# Patient Record
Sex: Male | Born: 1995 | Race: White | Hispanic: No | Marital: Single | Smoking: Never smoker
Health system: Southern US, Community
[De-identification: ages and names within clinical notes are randomized; demographics above are authoritative.]

---

## 2017-06-10 ENCOUNTER — Emergency Department
Admission: EM | Admit: 2017-06-10 | Discharge: 2017-06-10 | Disposition: A | Discharge status: Home / Self Care | Payer: BLUE CROSS/BLUE SHIELD | Attending: Emergency Medicine | Admitting: Emergency Medicine

## 2017-06-10 ENCOUNTER — Emergency Department: Payer: BLUE CROSS/BLUE SHIELD

## 2017-06-10 ENCOUNTER — Encounter: Payer: Self-pay | Admitting: Emergency Medicine

## 2017-06-10 DIAGNOSIS — R1032 Left lower quadrant pain: Secondary | ICD-10-CM

## 2017-06-10 DIAGNOSIS — R1012 Left upper quadrant pain: Secondary | ICD-10-CM | POA: Insufficient documentation

## 2017-06-10 DIAGNOSIS — R109 Unspecified abdominal pain: Secondary | ICD-10-CM

## 2017-06-10 LAB — URINALYSIS, COMPLETE (UACMP) WITH MICROSCOPIC
BACTERIA UA: NONE SEEN
BILIRUBIN URINE: NEGATIVE
Glucose, UA: NEGATIVE mg/dL
HGB URINE DIPSTICK: NEGATIVE
Ketones, ur: 20 mg/dL — AB
LEUKOCYTES UA: NEGATIVE
NITRITE: NEGATIVE
PH: 5 (ref 5.0–8.0)
Protein, ur: NEGATIVE mg/dL
SPECIFIC GRAVITY, URINE: 1.017 (ref 1.005–1.030)
Squamous Epithelial / LPF: NONE SEEN

## 2017-06-10 LAB — BASIC METABOLIC PANEL
ANION GAP: 8 (ref 5–15)
BUN: 13 mg/dL (ref 6–20)
CALCIUM: 9.7 mg/dL (ref 8.9–10.3)
CHLORIDE: 105 mmol/L (ref 101–111)
CO2: 26 mmol/L (ref 22–32)
Creatinine, Ser: 0.99 mg/dL (ref 0.61–1.24)
GFR calc Af Amer: >60 mL/min (ref >60)
GFR calc non Af Amer: >60 mL/min (ref >60)
GLUCOSE: 83 mg/dL (ref 65–99)
POTASSIUM: 4.3 mmol/L (ref 3.5–5.1)
Sodium: 139 mmol/L (ref 135–145)

## 2017-06-10 LAB — CBC
HCT: 46.4 % (ref 40.0–52.0)
Hemoglobin: 16.1 g/dL (ref 13.0–18.0)
MCH: 30.9 pg (ref 26.0–34.0)
MCHC: 34.6 g/dL (ref 32.0–36.0)
MCV: 89.3 fL (ref 80.0–100.0)
PLATELETS: 217 10*3/uL (ref 150–440)
RBC: 5.2 MIL/uL (ref 4.40–5.90)
RDW: 13 % (ref 11.5–14.5)
WBC: 4.7 10*3/uL (ref 3.8–10.6)

## 2017-06-10 MED ORDER — SODIUM CHLORIDE 0.9 % IV BOLUS (SEPSIS)
1000.0000 mL | Freq: Once | INTRAVENOUS | Status: AC
  Administered 2017-06-10: 1000 mL via INTRAVENOUS

## 2017-06-10 MED ORDER — KETOROLAC TROMETHAMINE 30 MG/ML IJ SOLN
30.0000 mg | Freq: Once | INTRAMUSCULAR | Status: AC
  Administered 2017-06-10: 30 mg via INTRAVENOUS
  Filled 2017-06-10: qty 1

## 2017-06-10 NOTE — ED Provider Notes (Signed)
[facility] Emergency Department Provider Note  ____________________________________________  Time seen: Approximately 8:04 AM  I have reviewed the triage vital signs and the nursing notes.   HISTORY  Chief Complaint Flank Pain    HPI Adam Collier is a 21 y.o. 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 The patient states that for approximately a week, he has had intermittent left flank pain that has been dull and initially started high in the left flank and has now moved to the left lower quadrant. He denies any nausea or vomiting, diarrhea, fever or chills, hematuria, dysuria or urinary frequency. He was seen at urgent care last week (no imaging performed) and initiated on ciprofloxacin, Flomax and diclofenac, but continues to have ongoing symptoms. His pain does improve with diclofenac. His pain is worst in the morning. Today, the patient has no change in his pain, but he is presenting for ongoing symptoms.   History reviewed. No pertinent past medical history.  There are no active problems to display for this patient.   No past surgical history on file.    Allergies Patient has no known allergies.  No family history on file.  Social History Social History  Substance Use Topics  . Smoking status: Not on file  . Smokeless tobacco: Not on file  . Alcohol use Not on file    Review of Systems Constitutional: No fever/chills. No lightheadedness or syncope. Eyes: No visual changes. ENT: No sore throat. No congestion or rhinorrhea. Cardiovascular: Denies chest pain. Denies palpitations. Respiratory: Denies shortness of breath.  No cough. Gastrointestinal: Positive left flank pain and left lower quadrant abdominal pain.  No nausea, no vomiting.  No diarrhea.  No constipation. Genitourinary: Negative for dysuria. No hematuria. No testicular or scrotal pain. No  penile discharge. Musculoskeletal: Negative for midline back pain. Skin: Negative for rash. Neurological: Negative for headaches. No focal numbness, tingling or weakness.     ____________________________________________   PHYSICAL EXAM:  VITAL SIGNS: ED Triage Vitals [06/10/17 0739]  Enc Vitals Group     BP (!) 144/88     Pulse Rate 79     Resp 18     Temp 97.9 F (36.6 C)     Temp Source Oral     SpO2 99 %     Weight 150 lb (68 kg)     Height 6' (1.829 m)     Head Circumference      Peak Flow      Pain Score 4     Pain Loc      Pain Edu?      Excl. in [place]?     Constitutional: Alert and oriented. Well appearing and in no acute distress. Answers questions appropriately. Eyes: Conjunctivae are normal.  EOMI. No scleral icterus. Head: Atraumatic. Nose: No congestion/rhinnorhea. Mouth/Throat: Mucous membranes are mildly dry.  Neck: No stridor.  Supple.  No JVD. No meningismus. Cardiovascular: Normal rate, regular rhythm. No murmurs, rubs or gallops.  Respiratory: Normal respiratory effort.  No accessory muscle use or retractions. Lungs CTAB.  No wheezes, rales or ronchi. Gastrointestinal: Soft, and nondistended.  Minimal tenderness to palpation with deep palpation in the low left lower quadrant. No reproducible CVA tenderness to palpation. No guarding or rebound.  No peritoneal signs. Musculoskeletal: No LE edema.  Neurologic:  A&Ox3.  Speech is clear.  Face and smile are symmetric.  EOMI.  Moves all extremities well.  Skin:  Skin is warm, dry and intact. No rash noted. Psychiatric: Mood and affect are normal. Speech and behavior are normal.  Normal judgement.  ____________________________________________   LABS (all labs ordered are listed, but only abnormal results are displayed)  Labs Reviewed  URINALYSIS, COMPLETE (UACMP) WITH MICROSCOPIC - Abnormal; Notable for the following:       Result Value   Color, Urine YELLOW (*)    APPearance CLEAR (*)    Ketones, ur 20  (*)    All other components within normal limits  CBC  BASIC METABOLIC PANEL   ____________________________________________  EKG  Not indicated ____________________________________________  RADIOLOGY  Ct Renal Stone Study  Result Date: 06/10/2017 CLINICAL DATA:  Acute left flank pain. EXAM: CT ABDOMEN AND PELVIS WITHOUT CONTRAST TECHNIQUE: Multidetector CT imaging of the abdomen and pelvis was performed following the standard protocol without IV contrast. COMPARISON:  None. FINDINGS: Lower chest: No acute abnormality. Hepatobiliary: No focal liver abnormality is seen. No gallstones, gallbladder wall thickening, or biliary dilatation. Pancreas: Unremarkable. No pancreatic ductal dilatation or surrounding inflammatory changes. Spleen: Normal in size without focal abnormality. Adrenals/Urinary Tract: Adrenal glands are unremarkable. Kidneys are normal, without renal calculi, focal lesion, or hydronephrosis. Bladder is unremarkable. Stomach/Bowel: Stomach is within normal limits. Appendix appears normal. No evidence of bowel wall thickening, distention, or inflammatory changes. Vascular/Lymphatic: No significant vascular findings are present. No enlarged abdominal or pelvic lymph nodes. Reproductive: Prostate is unremarkable. Other: No abdominal wall hernia or abnormality. No abdominopelvic ascites. Musculoskeletal: No acute or significant osseous findings. IMPRESSION: No renal or ureteral calculi are noted. No hydronephrosis or renal obstruction is noted. No abnormality seen in the abdomen or pelvis. Electronically Signed   By: Lupita Raider, M.D.   On: 06/10/2017 09:04    ____________________________________________   PROCEDURES  Procedure(s) performed: None  Procedures  Critical Care performed: No ____________________________________________   INITIAL IMPRESSION / ASSESSMENT AND PLAN / ED COURSE  Pertinent labs & imaging results that were available during my care of the patient  were reviewed by me and considered in my medical decision making (see chart for details).  21 y.o. male with ongoing but not worsening left flank pain now in the left lower quadrant, without any associated symptoms. Overall, the patient is well-appearing and hemodynamically stable, afebrile. His clinical picture is most consistent with renal colic or pyelonephritis, although pyelonephritis would be much worse likely in a male at this age. Musculoskeletal pain or colonic pathology is also possible. We will plan to get a CT, basic laboratory studies including urinalysis, and initiate symptomatic treatment. Plan reevaluation for final disposition. I will plan to speak to the patient's mother who lives in [place] over the phone when I have the results of his studies.  I reviewed the patient's medical chart  ----------------------------------------- 9:27 AM on 06/10/2017 -----------------------------------------  The patient continues to be hemodynamically stable and comfortable in the emergency department. His laboratory studies are reassuring with normal electrolytes, normal creatinine, no evidence of UTI or blood cells in the urine. He does have some minimal ketones, which may be related to some mild dehydration. His CT scan does not show any evidence of renal colic or other acute abnormalities in the abdomen or pelvis. At this time, the patient is safe for discharge home. I've spoken to his mother, who understands the results of his testing today. He will follow-up with you on student health. Return precautions were discussed.  ____________________________________________  FINAL CLINICAL IMPRESSION(S) / ED DIAGNOSES  Final diagnoses:  Left flank pain  Left lower quadrant pain         NEW MEDICATIONS STARTED DURING THIS VISIT:  New Prescriptions   No medications on file      Rockne Menghini, MD 06/10/17 [phone]

## 2017-06-10 NOTE — ED Triage Notes (Signed)
Pt reports left side flank pain for one week. Denies NVD, denies dysuria. Pt states was seen at an urgent care and given flomax and cipro but symptoms have not resolved.

## 2017-06-10 NOTE — Discharge Instructions (Signed)
Please discontinue all of the medications that you're started on an urgent care. You may take Tylenol or Motrin for pain. Please drink plenty of fluids stay well-hydrated.  Return to the emergency department if you develop severe pain, lightheadedness or fainting, fever or chills, nausea vomiting or diarrhea, or any other symptoms concerning to you.

## 2018-11-23 IMAGING — CT CT RENAL STONE PROTOCOL
3 of 4 series · 9 of 46 positions shown, 16 images · non-contrast
Comparison: None.

CLINICAL DATA: Acute left flank pain.

EXAM:
CT ABDOMEN AND PELVIS WITHOUT CONTRAST
TECHNIQUE: Multidetector CT imaging of the abdomen and pelvis was performed
following the standard protocol without IV contrast.

[Series 4: lung bases · axial · 0.62mm/px · z∈[-150,-60]mm · 5 of 28 slices shown, 10 images]
[im 5/28  soft-tissue]
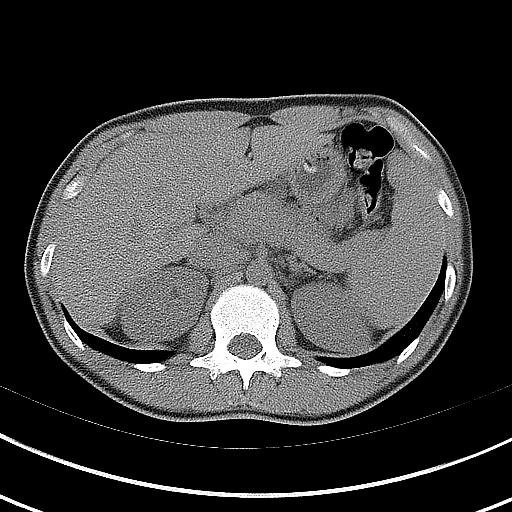
[im 5/28  bone]
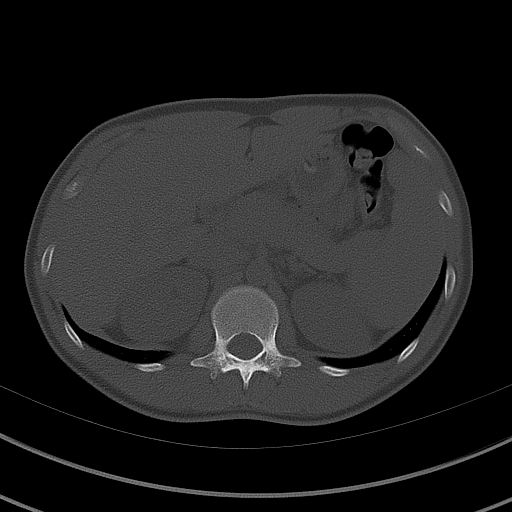
[im 10/28  soft-tissue]
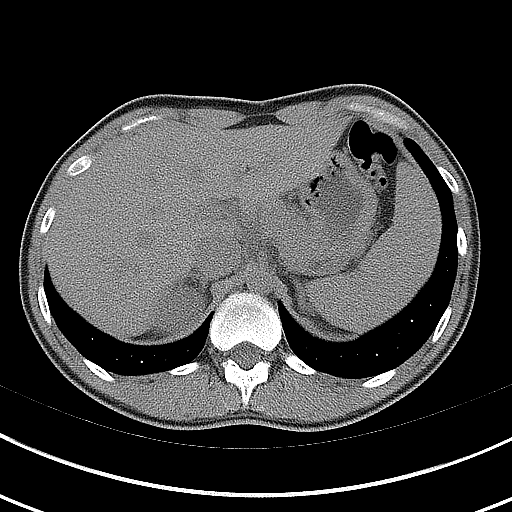
[im 10/28  lung]
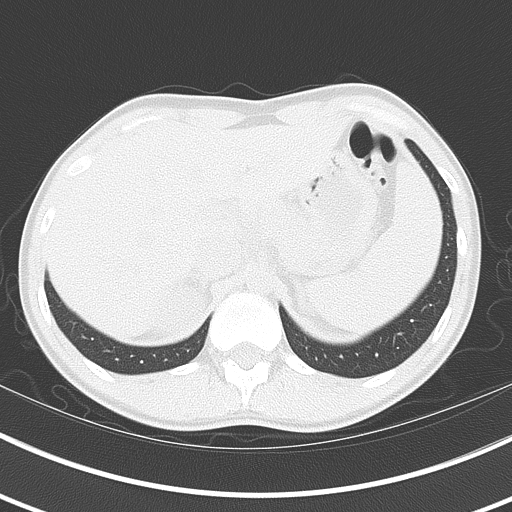
[im 14/28  soft-tissue]
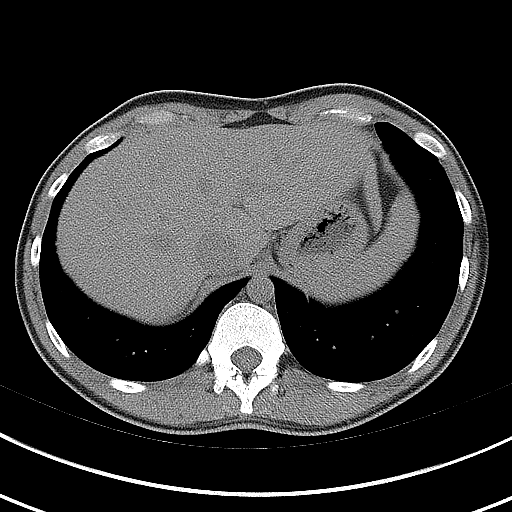
[im 14/28  lung]
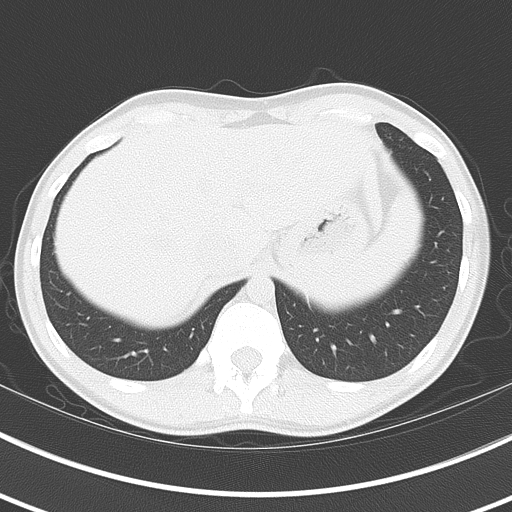
[im 19/28  soft-tissue]
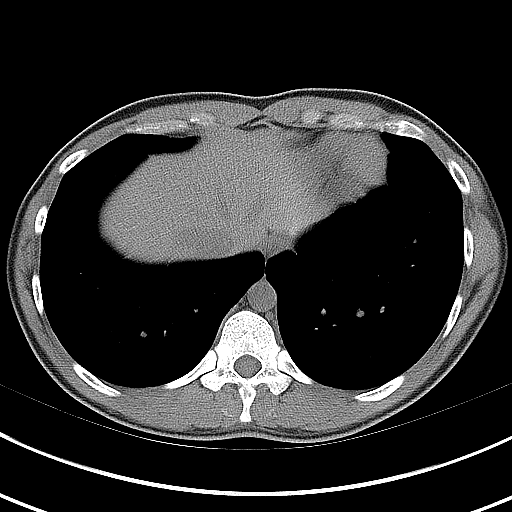
[im 19/28  lung]
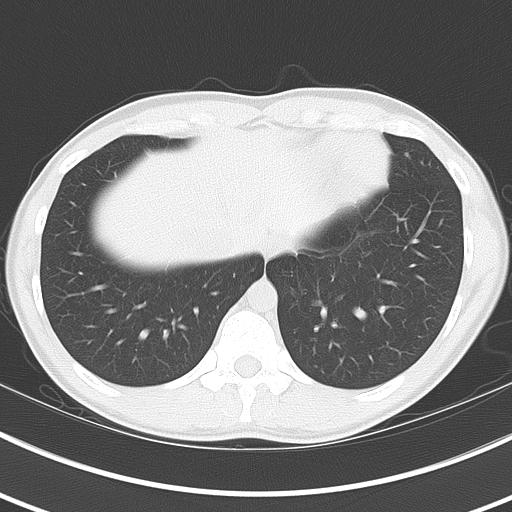
[im 23/28  soft-tissue]
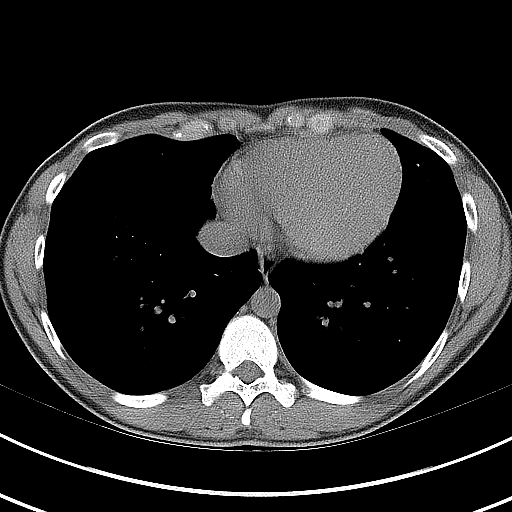
[im 23/28  lung]
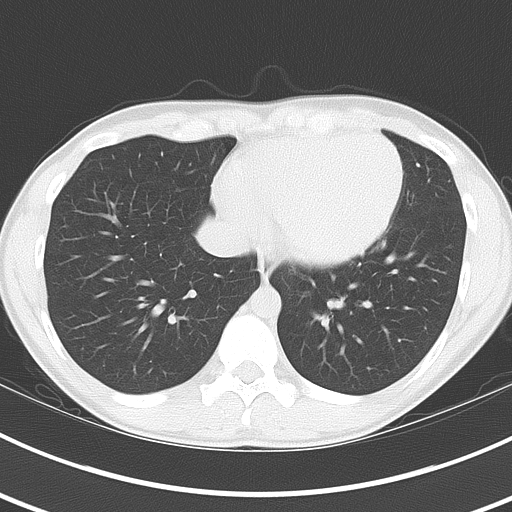

[Series 5: coronal · coronal · 0.76mm/px · 3 of 115 slices shown, 4 images]
[im 39/115  soft-tissue]
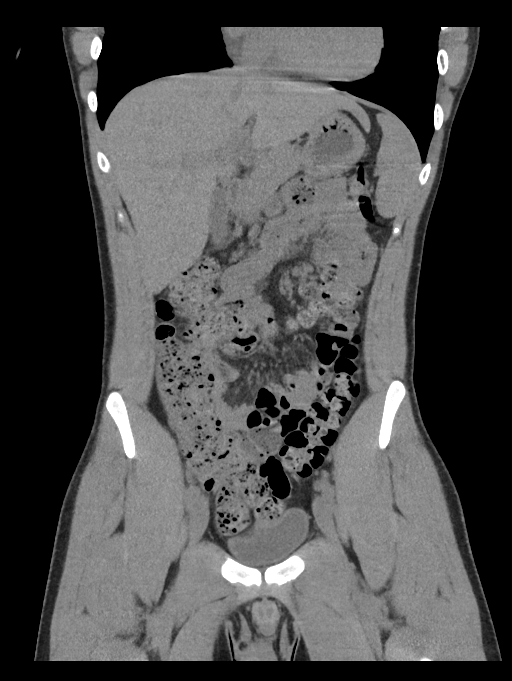
[im 51/115  soft-tissue]
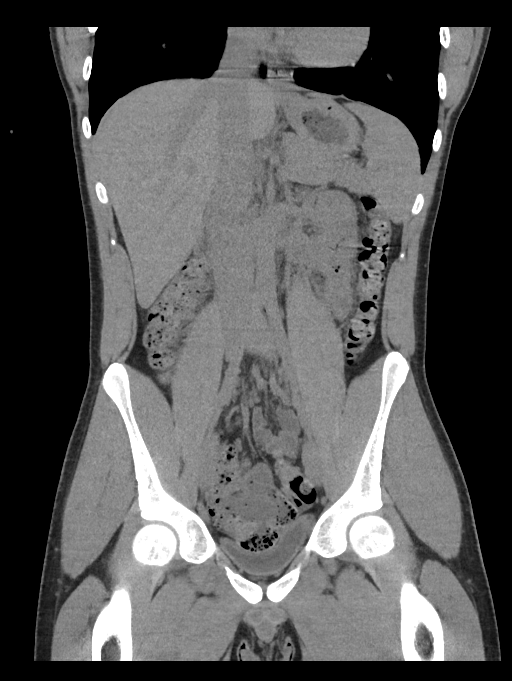
[im 51/115  bone]
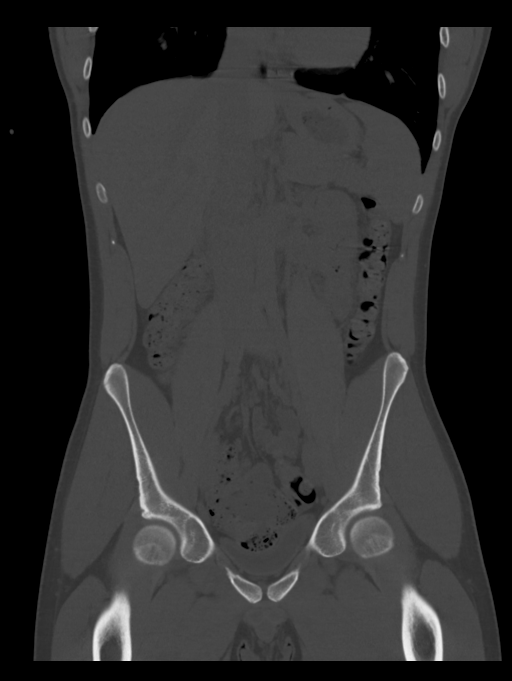
[im 64/115  soft-tissue]
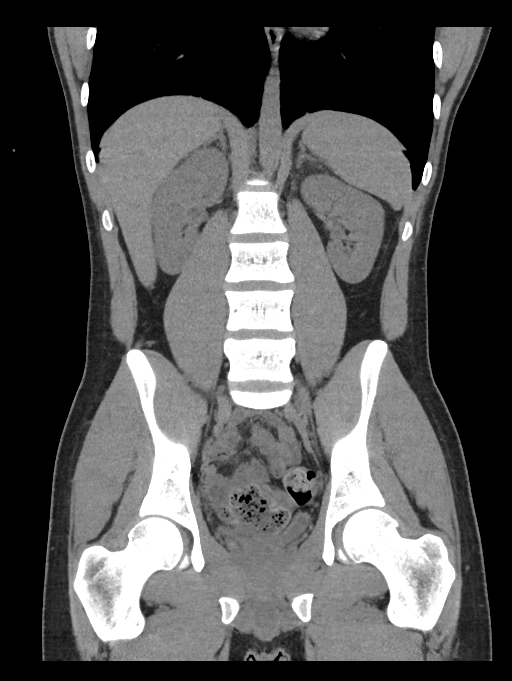

[Series 6: sagittal · sagittal · 0.53mm/px · 1 of 158 slices shown, 2 images]
[im 53/158  soft-tissue]
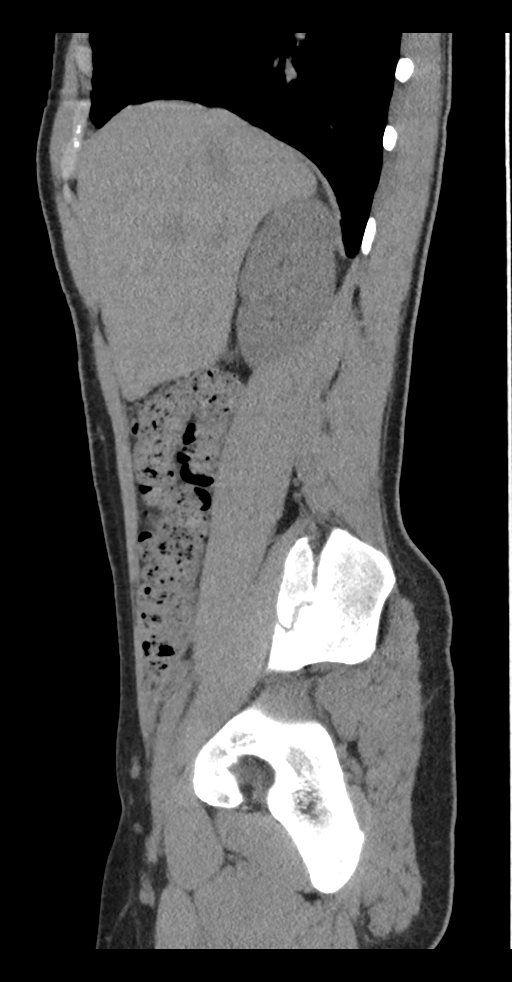
[im 53/158  bone]
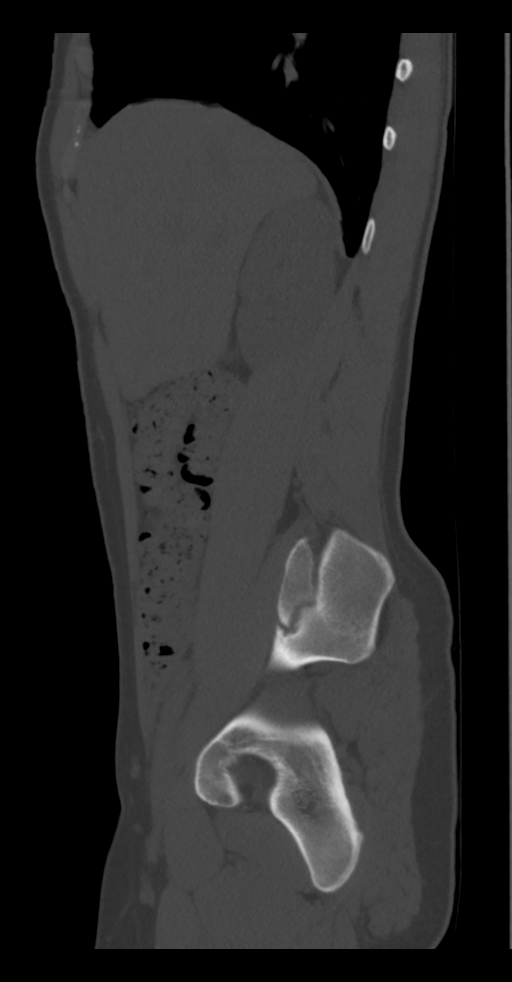

[9 of 46 positions shown; findings below may reference images not displayed]

FINDINGS: Lower chest: No acute abnormality.

Hepatobiliary: No focal liver abnormality is seen. No gallstones,
gallbladder wall thickening, or biliary dilatation.

Pancreas: Unremarkable. No pancreatic ductal dilatation or
surrounding inflammatory changes.

Spleen: Normal in size without focal abnormality.

Adrenals/Urinary Tract: Adrenal glands are unremarkable. Kidneys are
normal, without renal calculi, focal lesion, or hydronephrosis.
Bladder is unremarkable.

Stomach/Bowel: Stomach is within normal limits. Appendix appears
normal. No evidence of bowel wall thickening, distention, or
inflammatory changes.

Vascular/Lymphatic: No significant vascular findings are present. No
enlarged abdominal or pelvic lymph nodes.

Reproductive: Prostate is unremarkable.

Other: No abdominal wall hernia or abnormality. No abdominopelvic
ascites.

Musculoskeletal: No acute or significant osseous findings.
IMPRESSION: No renal or ureteral calculi are noted. No hydronephrosis or renal
obstruction is noted. No abnormality seen in the abdomen or pelvis.

## 2019-04-24 ENCOUNTER — Emergency Department: Payer: BC Managed Care – PPO

## 2019-04-24 ENCOUNTER — Emergency Department
Admission: EM | Admit: 2019-04-24 | Discharge: 2019-04-24 | Disposition: A | Discharge status: Home / Self Care | Payer: BC Managed Care – PPO | Attending: Emergency Medicine | Admitting: Emergency Medicine

## 2019-04-24 ENCOUNTER — Encounter: Payer: Self-pay | Admitting: Emergency Medicine

## 2019-04-24 ENCOUNTER — Other Ambulatory Visit: Payer: Self-pay

## 2019-04-24 DIAGNOSIS — K5901 Slow transit constipation: Secondary | ICD-10-CM | POA: Diagnosis not present

## 2019-04-24 DIAGNOSIS — R1032 Left lower quadrant pain: Secondary | ICD-10-CM | POA: Diagnosis present

## 2019-04-24 LAB — COMPREHENSIVE METABOLIC PANEL
ALT: 36 U/L (ref 0–44)
AST: 28 U/L (ref 15–41)
Albumin: 4.7 g/dL (ref 3.5–5.0)
Alkaline Phosphatase: 56 U/L (ref 38–126)
Anion gap: 14 (ref 5–15)
BUN: 15 mg/dL (ref 6–20)
CO2: 19 mmol/L — ABNORMAL LOW (ref 22–32)
Calcium: 9.4 mg/dL (ref 8.9–10.3)
Chloride: 104 mmol/L (ref 98–111)
Creatinine, Ser: 1.05 mg/dL (ref 0.61–1.24)
GFR calc Af Amer: >60 mL/min (ref >60)
GFR calc non Af Amer: >60 mL/min (ref >60)
Glucose, Bld: 134 mg/dL — ABNORMAL HIGH (ref 70–99)
Potassium: 2.7 mmol/L — CL (ref 3.5–5.1)
Sodium: 137 mmol/L (ref 135–145)
Total Bilirubin: 1.1 mg/dL (ref 0.3–1.2)
Total Protein: 7.9 g/dL (ref 6.5–8.1)

## 2019-04-24 LAB — CBC
HCT: 46.1 % (ref 39.0–52.0)
Hemoglobin: 16.4 g/dL (ref 13.0–17.0)
MCH: 30.8 pg (ref 26.0–34.0)
MCHC: 35.6 g/dL (ref 30.0–36.0)
MCV: 86.5 fL (ref 80.0–100.0)
Platelets: 276 10*3/uL (ref 150–400)
RBC: 5.33 MIL/uL (ref 4.22–5.81)
RDW: 11.7 % (ref 11.5–15.5)
WBC: 11.3 10*3/uL — ABNORMAL HIGH (ref 4.0–10.5)
nRBC: 0 % (ref 0.0–0.2)

## 2019-04-24 LAB — LIPASE, BLOOD: Lipase: 23 U/L (ref 11–51)

## 2019-04-24 MED ORDER — ONDANSETRON HCL 4 MG/2ML IJ SOLN
4.0000 mg | Freq: Once | INTRAMUSCULAR | Status: AC
  Administered 2019-04-24: 4 mg via INTRAVENOUS
  Filled 2019-04-24: qty 2

## 2019-04-24 MED ORDER — TRAMADOL HCL 50 MG PO TABS: 50.0000 mg | ORAL_TABLET | Freq: Four times a day (QID) | ORAL | 0 refills | Status: AC | PRN

## 2019-04-24 MED ORDER — MAGNESIUM CITRATE PO SOLN
1.0000 | Freq: Once | ORAL | Status: AC
  Administered 2019-04-24: 1 via ORAL
  Filled 2019-04-24: qty 296

## 2019-04-24 MED ORDER — POTASSIUM CHLORIDE CRYS ER 20 MEQ PO TBCR
40.0000 meq | EXTENDED_RELEASE_TABLET | Freq: Once | ORAL | Status: AC
  Administered 2019-04-24: 40 meq via ORAL
  Filled 2019-04-24: qty 2

## 2019-04-24 MED ORDER — SODIUM CHLORIDE 0.9 % IV SOLN
1000.0000 mL | Freq: Once | INTRAVENOUS | Status: AC
  Administered 2019-04-24: 1000 mL via INTRAVENOUS

## 2019-04-24 MED ORDER — POLYETHYLENE GLYCOL 3350 17 G PO PACK: 17.0000 g | PACK | Freq: Every day | ORAL | 0 refills | Status: AC

## 2019-04-24 MED ORDER — MORPHINE SULFATE (PF) 4 MG/ML IV SOLN
4.0000 mg | Freq: Once | INTRAVENOUS | Status: AC
  Administered 2019-04-24: 4 mg via INTRAVENOUS
  Filled 2019-04-24: qty 1

## 2019-04-24 MED ORDER — KETOROLAC TROMETHAMINE 30 MG/ML IJ SOLN
30.0000 mg | Freq: Once | INTRAMUSCULAR | Status: AC
  Administered 2019-04-24: 30 mg via INTRAVENOUS
  Filled 2019-04-24: qty 1

## 2019-04-24 NOTE — ED Provider Notes (Addendum)
[facility] Emergency Department Provider Note   ____________________________________________    I have reviewed the triage vital signs and the nursing notes.   HISTORY  Chief Complaint Abdominal Pain     HPI Adam Collier is a 23 y.o. male who presents with abrupt onset of left flank pain.  Patient reports pain is severe radiates to his left groin.  Reports it made him feel lightheaded.  Some nausea no vomiting.  Does report history of a kidney stone in the past and this is similar.  Denies fevers or chills.  No dysuria.  Has not take anything for this.  Symptoms started today.  History reviewed. No pertinent past medical history.  There are no active problems to display for this patient.   History reviewed. No pertinent surgical history.  Prior to Admission medications   Medication Sig Start Date End Date Taking? Authorizing Provider  polyethylene glycol (MIRALAX) 17 g packet Take 17 g by mouth daily. 04/24/19   Lavonia Drafts, MD  traMADol (ULTRAM) 50 MG tablet Take 1 tablet (50 mg total) by mouth every 6 (six) hours as needed. 04/24/19 04/23/20  Lavonia Drafts, MD     Allergies Patient has no known allergies.  History reviewed. No pertinent family history.  Social History Social History   Tobacco Use  . Smoking status: Never Smoker  . Smokeless tobacco: Never Used  Substance Use Topics  . Alcohol use: Not on file  . Drug use: Not on file    Review of Systems  Constitutional: Some dizziness Eyes: No visual changes.  ENT: No sore throat. Cardiovascular: Denies chest pain. Respiratory: Denies shortness of breath. Gastrointestinal: As above.   Genitourinary: No hematuria Musculoskeletal: Negative for back pain. Skin: Negative for rash. Neurological: Negative for headaches    ____________________________________________   PHYSICAL EXAM:  VITAL SIGNS: ED Triage Vitals [04/24/19 2007]  Enc Vitals Group     BP (!) 115/54    Pulse Rate 73     Resp 18     Temp 99 F (37.2 C)     Temp Source Oral     SpO2 100 %     Weight      Height      Head Circumference      Peak Flow      Pain Score      Pain Loc      Pain Edu?      Excl. in [place]?     Constitutional: Alert and oriented.   Nose: No congestion/rhinnorhea. Mouth/Throat: Mucous membranes are moist.    Cardiovascular: Normal rate, regular rhythm.  Good peripheral circulation. Respiratory: Normal respiratory effort.  No retractions.  Gastrointestinal: Soft and nontender. No distention.  No CVA tenderness.  Musculoskeletal:   Warm and well perfused Neurologic:  Normal speech and language. No gross focal neurologic deficits are appreciated.  Skin:  Skin is warm, dry and intact. No rash noted. Psychiatric: Mood and affect are normal. Speech and behavior are normal.  ____________________________________________   LABS (all labs ordered are listed, but only abnormal results are displayed)  Labs Reviewed  COMPREHENSIVE METABOLIC PANEL - Abnormal; Notable for the following components:      Result Value   Potassium 2.7 (*)    CO2 19 (*)    Glucose, Bld 134 (*)    All other components within normal limits  CBC - Abnormal; Notable for the following components:   WBC 11.3 (*)    All other components within normal  limits  LIPASE, BLOOD  URINALYSIS, COMPLETE (UACMP) WITH MICROSCOPIC   ____________________________________________  EKG  ED ECG REPORT I, Jene Everyobert Nicco Reaume, the attending physician, personally viewed and interpreted this ECG.  Date: 04/24/2019  Rhythm: normal sinus rhythm QRS Axis: normal Intervals: normal ST/T Wave abnormalities: normal Narrative Interpretation: no evidence of acute ischemia  ____________________________________________  RADIOLOGY  CT renal stone study no acute distress ____________________________________________   PROCEDURES  Procedure(s) performed: No  Procedures   Critical Care performed: No  ____________________________________________   INITIAL IMPRESSION / ASSESSMENT AND PLAN / ED COURSE  Pertinent labs & imaging results that were available during my care of the patient were reviewed by me and considered in my medical decision making (see chart for details).  Patient presents with abrupt onset of flank pain, suspicious for ureterolithiasis, will check labs, urinalysis, treat with IV Toradol, IV Zofran, IV fluids obtain CT renal stone study and reevaluate.  CT scan negative for kidney stone however scan does show extensive constipation this may be the cause of his pain.  We will give additional IV morphine as well as magnesium citrate.  ----------------------------------------- 9:51 PM on 04/24/2019 -----------------------------------------  Patient is feeling somewhat improved, we discussed results of CT scan.  We will start him on MiraLAX, he would like to go home and I think this is reasonable as long as he agrees to return if any worsening which he does.  We will give K-Dur for hypokalemia, possibly related to malabsorption given extent of constipation but anticipate this will improve after treatment.  GI follow-up given   ____________________________________________   FINAL CLINICAL IMPRESSION(S) / ED DIAGNOSES  Final diagnoses:  Left lower quadrant abdominal pain  Slow transit constipation        Note:  This document was prepared using Dragon voice recognition software and may include unintentional dictation errors.   Jene EveryKinner, Erhard Senske, MD 04/24/19 2152    Jene EveryKinner, Arina Torry, MD 04/24/19 2155

## 2019-04-24 NOTE — ED Notes (Signed)
MD at bedside to update patient

## 2019-04-24 NOTE — ED Notes (Signed)
Pt resting on stretcher with eyes closed and even respirations. No acute distress noted at this time.  

## 2019-04-24 NOTE — ED Triage Notes (Signed)
Pt c/o left sided upper and lower abdominal pain that was sudden onset 1 hour ago. Pt denies N/V/D but reports he gets lightheaded when sharp pain starts.

## 2019-11-13 ENCOUNTER — Ambulatory Visit: Payer: PRIVATE HEALTH INSURANCE | Attending: Internal Medicine

## 2019-11-13 ENCOUNTER — Other Ambulatory Visit: Payer: Self-pay

## 2019-11-13 DIAGNOSIS — Z23 Encounter for immunization: Secondary | ICD-10-CM

## 2019-11-13 NOTE — Progress Notes (Signed)
   Covid-19 Vaccination Clinic  Name:  Adam Collier    MRN: [id number] DOB: April 29, 1996  11/13/2019  Adam Collier was observed post Covid-19 immunization for 15 minutes without incident. He was provided with Vaccine Information Sheet and instruction to access the V-Safe system.   Adam Collier was instructed to call 911 with any severe reactions post vaccine: Marland Kitchen Difficulty breathing  . Swelling of face and throat  . A fast heartbeat  . A bad rash all over body  . Dizziness and weakness   Immunizations Administered    Name Date Dose VIS Date Route   Pfizer COVID-19 Vaccine 11/13/2019 12:22 PM 0.3 mL 07/21/2019 Intramuscular   Manufacturer: [employer], [employer]   Lot: [id number] [id number]   NDC: [address]-8

## 2019-12-05 ENCOUNTER — Ambulatory Visit: Payer: PRIVATE HEALTH INSURANCE

## 2019-12-12 ENCOUNTER — Ambulatory Visit: Payer: PRIVATE HEALTH INSURANCE

## 2019-12-13 ENCOUNTER — Ambulatory Visit: Payer: PRIVATE HEALTH INSURANCE | Attending: Internal Medicine

## 2019-12-13 DIAGNOSIS — Z23 Encounter for immunization: Secondary | ICD-10-CM

## 2019-12-13 NOTE — Progress Notes (Signed)
   Covid-19 Vaccination Clinic  Name:  Mayjor Ager    MRN: [id number] DOB: January 29, 1996  12/13/2019  Mr. Mazzoni was observed post Covid-19 immunization for 15 minutes without incident. He was provided with Vaccine Information Sheet and instruction to access the V-Safe system.   Mr. Hamre was instructed to call 911 with any severe reactions post vaccine: Marland Kitchen Difficulty breathing  . Swelling of face and throat  . A fast heartbeat  . A bad rash all over body  . Dizziness and weakness   Immunizations Administered    Name Date Dose VIS Date Route   Pfizer COVID-19 Vaccine 12/13/2019  9:45 AM 0.3 mL 10/04/2018 Intramuscular   Manufacturer: [employer], [employer]   Lot: [id number]   NDC: [address]-4

## 2020-10-06 IMAGING — CT CT RENAL STONE PROTOCOL
3 of 4 series · 9 of 46 positions shown, 14 images · non-contrast
Comparison: CT 06/10/2017

CLINICAL DATA: Left upper and lower abdominal pain, sudden onset

EXAM:
CT ABDOMEN AND PELVIS WITHOUT CONTRAST
TECHNIQUE: Multidetector CT imaging of the abdomen and pelvis was performed
following the standard protocol without IV contrast.

[Series 4: lung bases · axial · 0.68mm/px · z∈[-822,-736]mm · 5 of 27 slices shown, 10 images]
[im 5/27  soft-tissue]
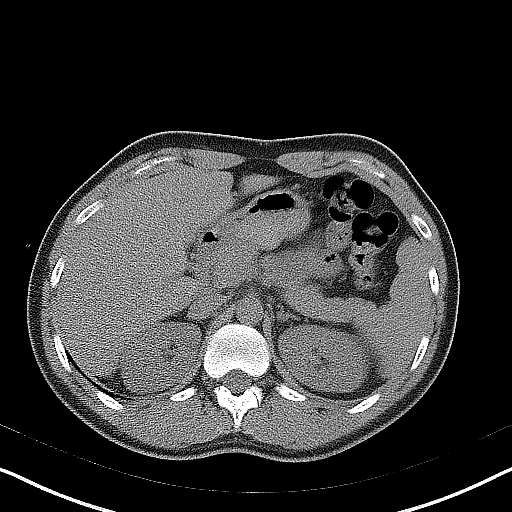
[im 5/27  bone]
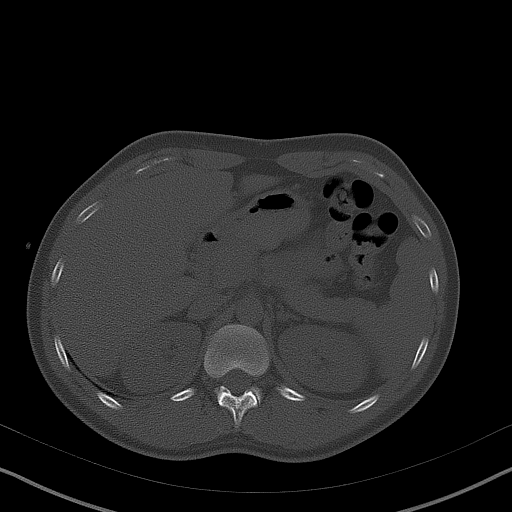
[im 9/27  soft-tissue]
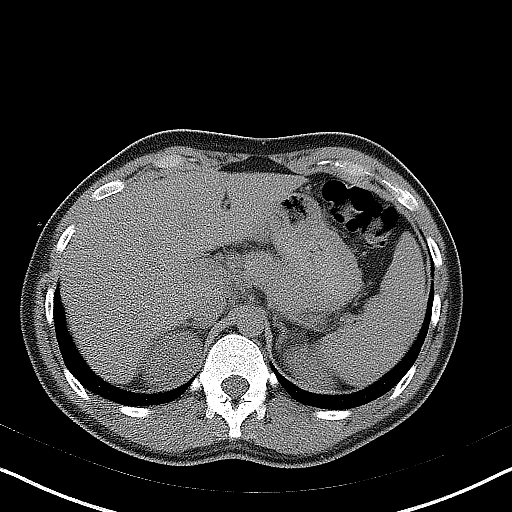
[im 9/27  lung]
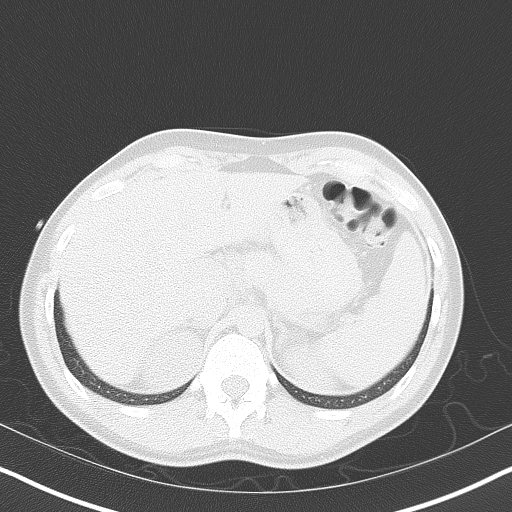
[im 14/27  soft-tissue]
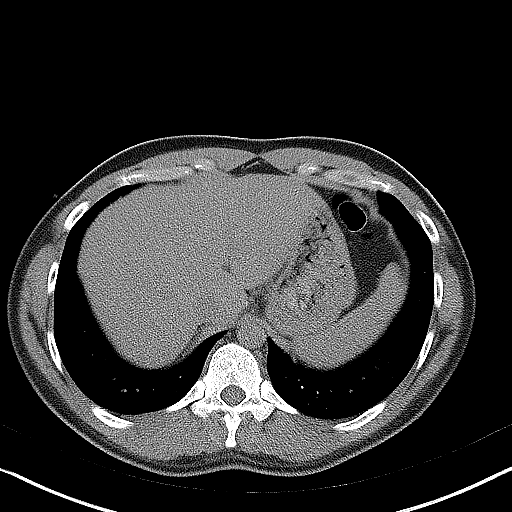
[im 14/27  lung]
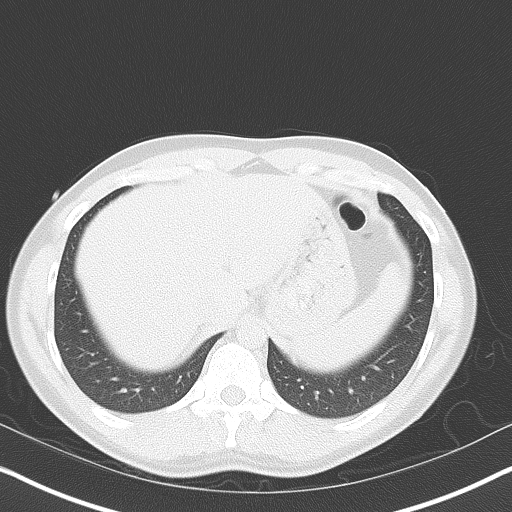
[im 18/27  soft-tissue]
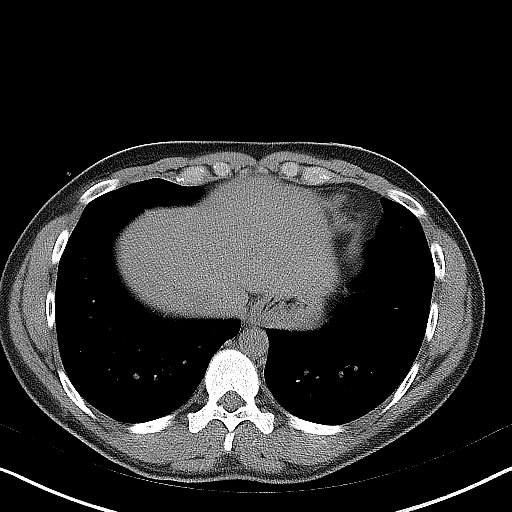
[im 18/27  lung]
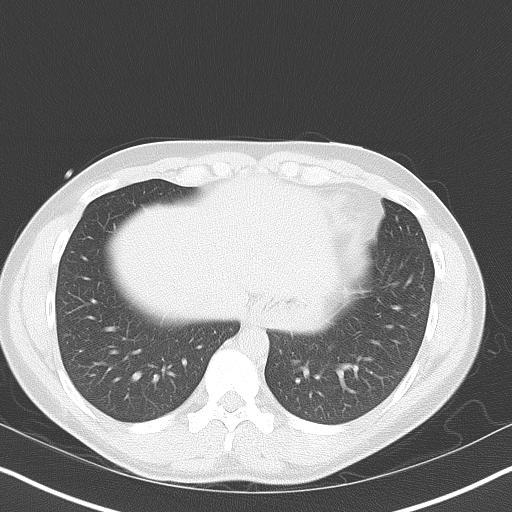
[im 22/27  soft-tissue]
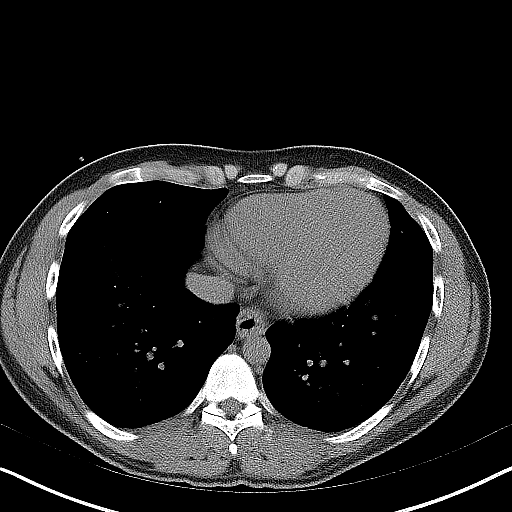
[im 22/27  lung]
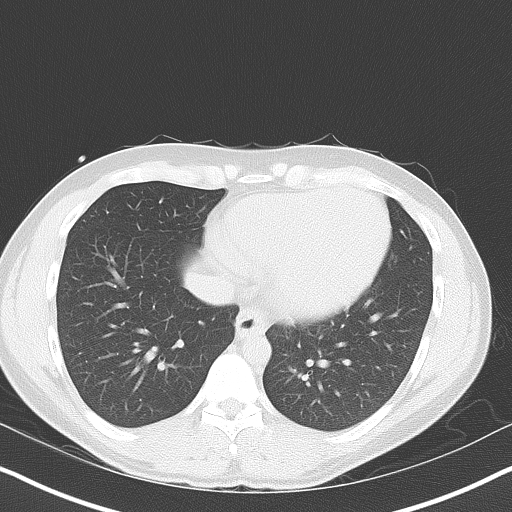

[Series 5: coronal · coronal · 0.67mm/px · 3 of 121 slices shown]
[im 41/121  soft-tissue]
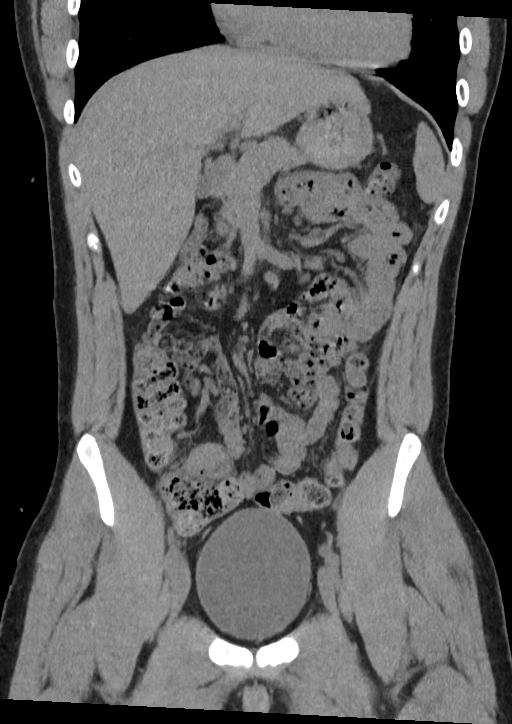
[im 54/121  soft-tissue]
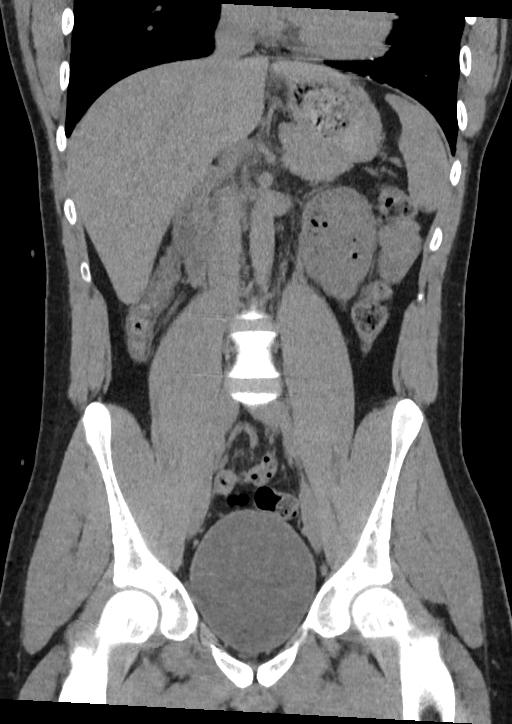
[im 67/121  soft-tissue]
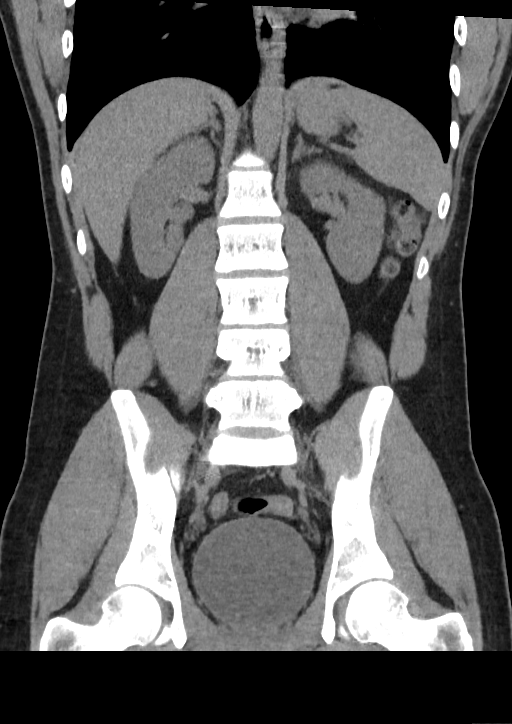

[Series 6: sagittal · sagittal · 0.47mm/px · 1 of 171 slices shown]
[im 57/171  soft-tissue]
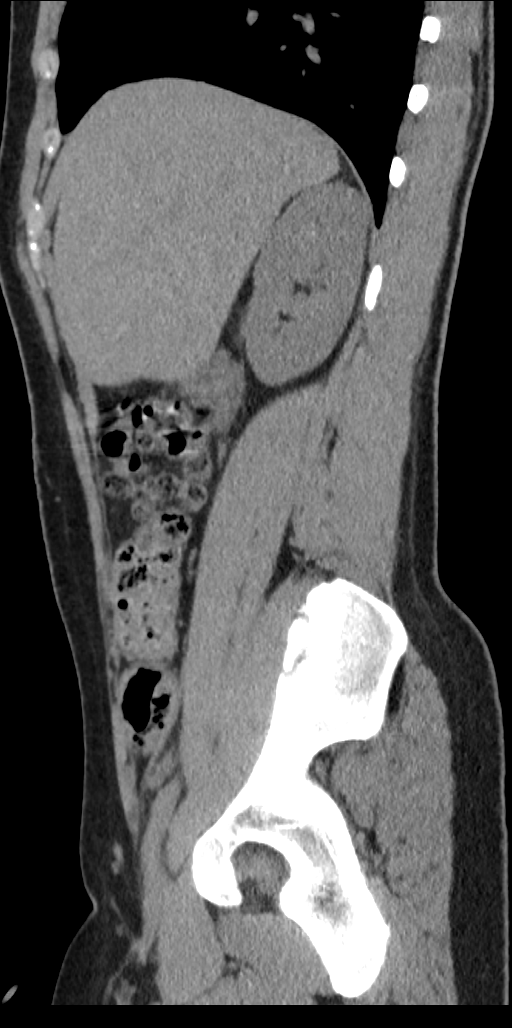

[9 of 46 positions shown; findings below may reference images not displayed]

FINDINGS: Lower chest: Lung bases are clear. Normal heart size. No pericardial
effusion.

Hepatobiliary: No focal liver abnormality is seen. No gallstones,
gallbladder wall thickening, or biliary dilatation.

Pancreas: Unremarkable. No pancreatic ductal dilatation or
surrounding inflammatory changes.

Spleen: Normal in size without focal abnormality.

Adrenals/Urinary Tract: Adrenal glands are unremarkable. Kidneys are
normal, without renal calculi, focal lesion, or hydronephrosis.
Bladder is unremarkable.

Stomach/Bowel: Distal esophagus, stomach and duodenal sweep are
unremarkable. Extensive fecalization of the small bowel contents. No
bowel wall thickening or dilatation. No evidence of obstruction. A
normal appendix is visualized. No colonic dilatation or wall
thickening. Moderate volume of stool through the colon.

Vascular/Lymphatic: No significant vascular findings are present. No
enlarged abdominal or pelvic lymph nodes.

Reproductive: The prostate and seminal vesicles are unremarkable.

Other: No abdominopelvic free fluid or free gas. No bowel containing
hernias.

Musculoskeletal: No acute osseous abnormality or suspicious osseous
lesion.
IMPRESSION: 1. Extensive fecalization of the small bowel contents and moderate
volume of stool in the colon, suggestive of delayed transit. No
evidence of mechanical obstruction.
2. No other acute abnormality in the abdomen or pelvis, specifically
no urolithiasis or hydronephrosis.
